# Patient Record
Sex: Female | Born: 1995 | Race: Black or African American | Hispanic: No | Marital: Single | Smoking: Never smoker
Health system: Southern US, Community
[De-identification: ages and names within clinical notes are randomized; demographics above are authoritative.]

## PROBLEM LIST (undated history)

## (undated) ENCOUNTER — Inpatient Hospital Stay (HOSPITAL_COMMUNITY): Payer: Self-pay

## (undated) DIAGNOSIS — D649 Anemia, unspecified: Secondary | ICD-10-CM

## (undated) HISTORY — PX: LIPOSUCTION: SHX10

## (undated) HISTORY — DX: Anemia, unspecified: D64.9

## (undated) HISTORY — PX: INDUCED ABORTION: SHX677

## (undated) HISTORY — PX: EYE SURGERY: SHX253

## (undated) HISTORY — PX: OTHER SURGICAL HISTORY: SHX169

## (undated) HISTORY — PX: BREAST SURGERY: SHX581

---
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
## 2014-05-08 LAB — OB RESULTS CONSOLE HEPATITIS B SURFACE ANTIGEN: HEP B S AG: NEGATIVE

## 2014-05-08 LAB — OB RESULTS CONSOLE RPR: RPR: NONREACTIVE

## 2014-05-08 LAB — OB RESULTS CONSOLE ANTIBODY SCREEN: Antibody Screen: NEGATIVE

## 2014-05-08 LAB — OB RESULTS CONSOLE ABO/RH: RH Type: POSITIVE

## 2014-05-08 LAB — OB RESULTS CONSOLE RUBELLA ANTIBODY, IGM: Rubella: IMMUNE

## 2014-05-08 LAB — OB RESULTS CONSOLE HIV ANTIBODY (ROUTINE TESTING): HIV: NONREACTIVE

## 2014-05-29 LAB — OB RESULTS CONSOLE GC/CHLAMYDIA
CHLAMYDIA, DNA PROBE: NEGATIVE
Gonorrhea: NEGATIVE

## 2014-10-29 ENCOUNTER — Inpatient Hospital Stay (HOSPITAL_COMMUNITY)
Admission: AD | Admit: 2014-10-29 | Discharge: 2014-10-30 | Disposition: A | Discharge status: Home / Self Care | Payer: BC Managed Care – PPO | Source: Ambulatory Visit | Attending: Obstetrics | Admitting: Obstetrics

## 2014-10-29 ENCOUNTER — Encounter (HOSPITAL_COMMUNITY): Payer: Self-pay | Admitting: *Deleted

## 2014-10-29 DIAGNOSIS — R109 Unspecified abdominal pain: Secondary | ICD-10-CM | POA: Diagnosis present

## 2014-10-29 DIAGNOSIS — Z3A31 31 weeks gestation of pregnancy: Secondary | ICD-10-CM | POA: Insufficient documentation

## 2014-10-29 DIAGNOSIS — O9989 Other specified diseases and conditions complicating pregnancy, childbirth and the puerperium: Secondary | ICD-10-CM | POA: Insufficient documentation

## 2014-10-29 DIAGNOSIS — R1030 Lower abdominal pain, unspecified: Secondary | ICD-10-CM | POA: Diagnosis not present

## 2014-10-29 DIAGNOSIS — M5430 Sciatica, unspecified side: Secondary | ICD-10-CM

## 2014-10-29 LAB — URINALYSIS, ROUTINE W REFLEX MICROSCOPIC
Bilirubin Urine: NEGATIVE
GLUCOSE, UA: NEGATIVE mg/dL
HGB URINE DIPSTICK: NEGATIVE
Ketones, ur: NEGATIVE mg/dL
Nitrite: NEGATIVE
PH: 6 (ref 5.0–8.0)
PROTEIN: NEGATIVE mg/dL
Specific Gravity, Urine: 1.025 (ref 1.005–1.030)
Urobilinogen, UA: 0.2 mg/dL (ref 0.0–1.0)

## 2014-10-29 LAB — URINE MICROSCOPIC-ADD ON

## 2014-10-29 NOTE — MAU Note (Signed)
Patient presents at 7531 weeks gestation with complaint of lower abdominal pain. Denies bleeding or LOF. Fetus active.

## 2014-10-29 NOTE — MAU Provider Note (Signed)
  History     CSN: [id number]  Arrival date and time: 10/29/14 2103 Nurse call to provider - orders gven @ 2238 Provider here to see patient @ 2330    Chief Complaint  Patient presents with  . Abdominal Pain   HPI  Sharp abdominal pain that was shooting into buttocks - so hard unable to walk Happened x 1 this evening when getting off work Some lower abdominal stretching pains / intermittent vaginal and rectal pressure No bleeding or discharge No LOF Very active FM  Past Medical History  Diagnosis Date  . Anemia     Past Surgical History  Procedure Laterality Date  . Eye surgery      History reviewed. No pertinent family history.  History  Substance Use Topics  . Smoking status: Never Smoker   . Smokeless tobacco: Not on file  . Alcohol Use: No    Allergies:  Allergies  Allergen Reactions  . Latex Swelling    Prescriptions prior to admission  Medication Sig Dispense Refill Last Dose  . Prenatal Vit-Fe Fumarate-FA (PRENATAL MULTIVITAMIN) TABS tablet Take 1 tablet by mouth daily at 12 noon.   Past Week at Unknown time    ROS  + FM Pain x 1 this evening after getting off work Intermittent pressure  No urinary frequency or urgency No bleeding or vaginal discharge  Physical Exam   Blood pressure 127/77, pulse 71, temperature 98.9 F (37.2 C), temperature source Oral, resp. rate 16, height 5\' 5"  (1.651 m), weight 86.354 kg (190 lb 6 oz), last menstrual period 03/21/2014.  Physical Exam  Alert and oriented x 3 NAD or pain Abdomen soft and non-tender Leopalds cephalic - non-tender with exam / palpation VE - cervix closed / soft / long ~ 3cm length in vagina / vtx -3 ballotable  FHR 140 - moderate variability / + accels / no decels toco occasional ctx x 30-40 seconds  Urinalysis - 1025 / + WBC / TNTC bacteria Urine culture pending  MAU Course  Procedures  NST - reactive  Assessment and Plan  31.5 weeks with normal pregnancy discomfort No  evidence of PTL or UTI  1) reviewed normal physiology of pregnancy / MS discomforts / sciatica 2) comfort measures reviewed - position changes / tub or shower / increase water hydration 3) DC home 4) ROB as scheduled tomorrow 5) call prior to hospital visit  Marlinda MikeBAILEY, Leah Titsworth 10/29/2014, 11:39 PM

## 2014-10-30 DIAGNOSIS — O9989 Other specified diseases and conditions complicating pregnancy, childbirth and the puerperium: Secondary | ICD-10-CM | POA: Diagnosis not present

## 2014-10-31 LAB — CULTURE, OB URINE
Colony Count: >=100000
Special Requests: NORMAL

## 2014-11-16 NOTE — L&D Delivery Note (Signed)
Delivery Note At 11:16 AM a healthy female was delivered via Vaginal, Spontaneous Delivery (Presentation: Left Occiput Anterior).  APGAR: 9, 9; weight 6 lb 7.3 oz (2929 g).  3 tight Nuchal cords.  Placenta status: Intact, Spontaneous.  Cord: 3 vessels with the following complications: None.  Cord pH: N/A  Anesthesia: Epidural  Episiotomy: None Lacerations: 1st degree;Perineal and left vulvar tears Suture Repair: 2.0 3.0 vicryl rapide Est. Blood Loss (mL): 300  Mom to postpartum.  Baby to Couplet care / Skin to Skin.  Gwenith Tschida,MARIE-LYNE 12/19/2014, 8:46 PM

## 2014-11-28 LAB — OB RESULTS CONSOLE GBS: GBS: NEGATIVE

## 2014-12-18 ENCOUNTER — Inpatient Hospital Stay (HOSPITAL_COMMUNITY)
Admission: AD | Admit: 2014-12-18 | Discharge: 2014-12-18 | Disposition: A | Discharge status: Home / Self Care | Payer: BLUE CROSS/BLUE SHIELD | Source: Ambulatory Visit | Attending: Obstetrics | Admitting: Obstetrics

## 2014-12-18 ENCOUNTER — Encounter (HOSPITAL_COMMUNITY): Payer: Self-pay | Admitting: *Deleted

## 2014-12-18 ENCOUNTER — Inpatient Hospital Stay (HOSPITAL_COMMUNITY)
Admission: AD | Admit: 2014-12-18 | Discharge: 2014-12-21 | DRG: 775 | Disposition: A | Discharge status: Home / Self Care | Payer: BLUE CROSS/BLUE SHIELD | Source: Ambulatory Visit | Attending: Obstetrics & Gynecology | Admitting: Obstetrics & Gynecology

## 2014-12-18 DIAGNOSIS — Z91040 Latex allergy status: Secondary | ICD-10-CM

## 2014-12-18 DIAGNOSIS — O471 False labor at or after 37 completed weeks of gestation: Secondary | ICD-10-CM | POA: Insufficient documentation

## 2014-12-18 DIAGNOSIS — Z3A39 39 weeks gestation of pregnancy: Secondary | ICD-10-CM | POA: Insufficient documentation

## 2014-12-18 DIAGNOSIS — O9902 Anemia complicating childbirth: Secondary | ICD-10-CM | POA: Diagnosis present

## 2014-12-18 DIAGNOSIS — D62 Acute posthemorrhagic anemia: Secondary | ICD-10-CM | POA: Diagnosis present

## 2014-12-18 DIAGNOSIS — Z3400 Encounter for supervision of normal first pregnancy, unspecified trimester: Secondary | ICD-10-CM

## 2014-12-18 NOTE — MAU Note (Signed)
PT  SAYS SHE STARTED  HURTING  BAD AT 0230.    VE IN OFFICE ON Monday  -  1-2  CM.     DENIES HSV AND MRSA.    GBS-  NEG

## 2014-12-18 NOTE — Discharge Instructions (Signed)
Braxton Hicks Contractions °Contractions of the uterus can occur throughout pregnancy. Contractions are not always a sign that you are in labor.  °WHAT ARE BRAXTON HICKS CONTRACTIONS?  °Contractions that occur before labor are called Braxton Hicks contractions, or false labor. Toward the end of pregnancy (32-34 weeks), these contractions can develop more often and may become more forceful. This is not true labor because these contractions do not result in opening (dilatation) and thinning of the cervix. They are sometimes difficult to tell apart from true labor because these contractions can be forceful and people have different pain tolerances. You should not feel embarrassed if you go to the hospital with false labor. Sometimes, the only way to tell if you are in true labor is for your health care provider to look for changes in the cervix. °If there are no prenatal problems or other health problems associated with the pregnancy, it is completely safe to be sent home with false labor and await the onset of true labor. °HOW CAN YOU TELL THE DIFFERENCE BETWEEN TRUE AND FALSE LABOR? °False Labor °· The contractions of false labor are usually shorter and not as hard as those of true labor.   °· The contractions are usually irregular.   °· The contractions are often felt in the front of the lower abdomen and in the groin.   °· The contractions may go away when you walk around or change positions while lying down.   °· The contractions get weaker and are shorter lasting as time goes on.   °· The contractions do not usually become progressively stronger, regular, and closer together as with true labor.   °True Labor °· Contractions in true labor last 30-70 seconds, become very regular, usually become more intense, and increase in frequency.   °· The contractions do not go away with walking.   °· The discomfort is usually felt in the top of the uterus and spreads to the lower abdomen and low back.   °· True labor can be  determined by your health care provider with an exam. This will show that the cervix is dilating and getting thinner.   °WHAT TO REMEMBER °· Keep up with your usual exercises and follow other instructions given by your health care provider.   °· Take medicines as directed by your health care provider.   °· Keep your regular prenatal appointments.   °· Eat and drink lightly if you think you are going into labor.   °· If Braxton Hicks contractions are making you uncomfortable:   °¨ Change your position from lying down or resting to walking, or from walking to resting.   °¨ Sit and rest in a tub of warm water.   °¨ Drink 2-3 glasses of water. Dehydration may cause these contractions.   °¨ Do slow and deep breathing several times an hour.   °WHEN SHOULD I SEEK IMMEDIATE MEDICAL CARE? °Seek immediate medical care if: °· Your contractions become stronger, more regular, and closer together.   °· You have fluid leaking or gushing from your vagina.   °· You have a fever.    °· You have vaginal bleeding.   °· You have continuous abdominal pain.   °· You have low back pain that you never had before.   °· You feel your baby's head pushing down and causing pelvic pressure.   °· Your baby is not moving as much as it used to.   °Document Released: 11/02/2005 Document Revised: 11/07/2013 Document Reviewed: 08/14/2013 °ExitCare® Patient Information ©2015 [employer]. This information is not intended to replace advice given to you by your health care provider. Make sure you discuss any   questions you have with your health care provider. ° °

## 2014-12-18 NOTE — MAU Note (Signed)
Pt reports contractions, denies bleeding or ROM.  

## 2014-12-19 ENCOUNTER — Inpatient Hospital Stay (HOSPITAL_COMMUNITY): Payer: BLUE CROSS/BLUE SHIELD | Admitting: Anesthesiology

## 2014-12-19 ENCOUNTER — Encounter (HOSPITAL_COMMUNITY): Payer: Self-pay | Admitting: *Deleted

## 2014-12-19 DIAGNOSIS — Z91040 Latex allergy status: Secondary | ICD-10-CM | POA: Diagnosis not present

## 2014-12-19 DIAGNOSIS — Z3400 Encounter for supervision of normal first pregnancy, unspecified trimester: Secondary | ICD-10-CM

## 2014-12-19 DIAGNOSIS — D62 Acute posthemorrhagic anemia: Secondary | ICD-10-CM | POA: Diagnosis present

## 2014-12-19 DIAGNOSIS — Z3A39 39 weeks gestation of pregnancy: Secondary | ICD-10-CM | POA: Diagnosis present

## 2014-12-19 DIAGNOSIS — O9902 Anemia complicating childbirth: Secondary | ICD-10-CM | POA: Diagnosis present

## 2014-12-19 LAB — CBC
HCT: 34 % — ABNORMAL LOW (ref 36.0–46.0)
Hemoglobin: 11.7 g/dL — ABNORMAL LOW (ref 12.0–15.0)
MCH: 27.6 pg (ref 26.0–34.0)
MCHC: 34.4 g/dL (ref 30.0–36.0)
MCV: 80.2 fL (ref 78.0–100.0)
Platelets: 201 10*3/uL (ref 150–400)
RBC: 4.24 MIL/uL (ref 3.87–5.11)
RDW: 14.6 % (ref 11.5–15.5)
WBC: 11.5 10*3/uL — ABNORMAL HIGH (ref 4.0–10.5)

## 2014-12-19 LAB — TYPE AND SCREEN
ABO/RH(D): O POS
ANTIBODY SCREEN: NEGATIVE

## 2014-12-19 LAB — ABO/RH: ABO/RH(D): O POS

## 2014-12-19 MED ORDER — ONDANSETRON HCL 4 MG PO TABS: 4.0000 mg | ORAL_TABLET | ORAL | Status: DC | PRN

## 2014-12-19 MED ORDER — PHENYLEPHRINE 40 MCG/ML (10ML) SYRINGE FOR IV PUSH (FOR BLOOD PRESSURE SUPPORT)
80.0000 ug | PREFILLED_SYRINGE | INTRAVENOUS | Status: DC | PRN
  Filled 2014-12-19: qty 2
  Filled 2014-12-19: qty 20

## 2014-12-19 MED ORDER — OXYTOCIN 40 UNITS IN LACTATED RINGERS INFUSION - SIMPLE MED
1.0000 m[IU]/min | INTRAVENOUS | Status: DC
  Administered 2014-12-19: 2 m[IU]/min via INTRAVENOUS

## 2014-12-19 MED ORDER — LIDOCAINE HCL (PF) 1 % IJ SOLN
30.0000 mL | INTRAMUSCULAR | Status: DC | PRN
  Administered 2014-12-19: 30 mL via SUBCUTANEOUS
  Filled 2014-12-19: qty 30

## 2014-12-19 MED ORDER — TETANUS-DIPHTH-ACELL PERTUSSIS 5-2.5-18.5 LF-MCG/0.5 IM SUSP: 0.5000 mL | Freq: Once | INTRAMUSCULAR | Status: DC

## 2014-12-19 MED ORDER — LACTATED RINGERS IV SOLN: 500.0000 mL | Freq: Once | INTRAVENOUS | Status: DC

## 2014-12-19 MED ORDER — DIPHENHYDRAMINE HCL 25 MG PO CAPS: 25.0000 mg | ORAL_CAPSULE | Freq: Four times a day (QID) | ORAL | Status: DC | PRN

## 2014-12-19 MED ORDER — SIMETHICONE 80 MG PO CHEW: 80.0000 mg | CHEWABLE_TABLET | ORAL | Status: DC | PRN

## 2014-12-19 MED ORDER — BENZOCAINE-MENTHOL 20-0.5 % EX AERO: 1.0000 "application " | INHALATION_SPRAY | CUTANEOUS | Status: DC | PRN

## 2014-12-19 MED ORDER — ONDANSETRON HCL 4 MG/2ML IJ SOLN: 4.0000 mg | Freq: Four times a day (QID) | INTRAMUSCULAR | Status: DC | PRN

## 2014-12-19 MED ORDER — LACTATED RINGERS IV SOLN: 500.0000 mL | INTRAVENOUS | Status: DC | PRN

## 2014-12-19 MED ORDER — LANOLIN HYDROUS EX OINT: TOPICAL_OINTMENT | CUTANEOUS | Status: DC | PRN

## 2014-12-19 MED ORDER — FENTANYL 2.5 MCG/ML BUPIVACAINE 1/10 % EPIDURAL INFUSION (WH - ANES)
14.0000 mL/h | INTRAMUSCULAR | Status: DC | PRN
  Administered 2014-12-19: 14 mL/h via EPIDURAL

## 2014-12-19 MED ORDER — OXYCODONE-ACETAMINOPHEN 5-325 MG PO TABS
1.0000 | ORAL_TABLET | ORAL | Status: DC | PRN
  Administered 2014-12-19: 1 via ORAL

## 2014-12-19 MED ORDER — LACTATED RINGERS IV SOLN
INTRAVENOUS | Status: DC
  Administered 2014-12-19: 04:00:00 via INTRAVENOUS

## 2014-12-19 MED ORDER — ACETAMINOPHEN 325 MG PO TABS: 650.0000 mg | ORAL_TABLET | ORAL | Status: DC | PRN

## 2014-12-19 MED ORDER — ZOLPIDEM TARTRATE 5 MG PO TABS: 5.0000 mg | ORAL_TABLET | Freq: Every evening | ORAL | Status: DC | PRN

## 2014-12-19 MED ORDER — OXYCODONE-ACETAMINOPHEN 5-325 MG PO TABS: 2.0000 | ORAL_TABLET | ORAL | Status: DC | PRN

## 2014-12-19 MED ORDER — IBUPROFEN 600 MG PO TABS
600.0000 mg | ORAL_TABLET | Freq: Four times a day (QID) | ORAL | Status: DC
  Administered 2014-12-19 (×2): 600 mg via ORAL
  Filled 2014-12-19 (×2): qty 1

## 2014-12-19 MED ORDER — LIDOCAINE HCL (PF) 1 % IJ SOLN
INTRAMUSCULAR | Status: DC | PRN
  Administered 2014-12-19: 4 mL
  Administered 2014-12-19: 6 mL

## 2014-12-19 MED ORDER — EPHEDRINE 5 MG/ML INJ
10.0000 mg | INTRAVENOUS | Status: DC | PRN
  Filled 2014-12-19: qty 2

## 2014-12-19 MED ORDER — PRENATAL MULTIVITAMIN CH: 1.0000 | ORAL_TABLET | Freq: Every day | ORAL | Status: DC

## 2014-12-19 MED ORDER — PHENYLEPHRINE 40 MCG/ML (10ML) SYRINGE FOR IV PUSH (FOR BLOOD PRESSURE SUPPORT)
80.0000 ug | PREFILLED_SYRINGE | INTRAVENOUS | Status: DC | PRN
  Filled 2014-12-19: qty 2

## 2014-12-19 MED ORDER — DIBUCAINE 1 % RE OINT: 1.0000 "application " | TOPICAL_OINTMENT | RECTAL | Status: DC | PRN

## 2014-12-19 MED ORDER — OXYCODONE-ACETAMINOPHEN 5-325 MG PO TABS
1.0000 | ORAL_TABLET | ORAL | Status: DC | PRN
  Filled 2014-12-19: qty 1

## 2014-12-19 MED ORDER — OXYTOCIN 40 UNITS IN LACTATED RINGERS INFUSION - SIMPLE MED
62.5000 mL/h | INTRAVENOUS | Status: DC
  Administered 2014-12-19: 62.5 mL/h via INTRAVENOUS
  Filled 2014-12-19: qty 1000

## 2014-12-19 MED ORDER — FLEET ENEMA 7-19 GM/118ML RE ENEM: 1.0000 | ENEMA | RECTAL | Status: DC | PRN

## 2014-12-19 MED ORDER — SENNOSIDES-DOCUSATE SODIUM 8.6-50 MG PO TABS: 2.0000 | ORAL_TABLET | ORAL | Status: DC

## 2014-12-19 MED ORDER — WITCH HAZEL-GLYCERIN EX PADS
1.0000 "application " | MEDICATED_PAD | CUTANEOUS | Status: DC | PRN
  Administered 2014-12-19: 1 via TOPICAL

## 2014-12-19 MED ORDER — OXYTOCIN BOLUS FROM INFUSION: 500.0000 mL | INTRAVENOUS | Status: DC

## 2014-12-19 MED ORDER — CITRIC ACID-SODIUM CITRATE 334-500 MG/5ML PO SOLN: 30.0000 mL | ORAL | Status: DC | PRN

## 2014-12-19 MED ORDER — ONDANSETRON HCL 4 MG/2ML IJ SOLN: 4.0000 mg | INTRAMUSCULAR | Status: DC | PRN

## 2014-12-19 MED ORDER — TERBUTALINE SULFATE 1 MG/ML IJ SOLN
0.2500 mg | Freq: Once | INTRAMUSCULAR | Status: DC | PRN
  Filled 2014-12-19: qty 1

## 2014-12-19 MED ORDER — DIPHENHYDRAMINE HCL 50 MG/ML IJ SOLN: 12.5000 mg | INTRAMUSCULAR | Status: DC | PRN

## 2014-12-19 MED ORDER — FENTANYL 2.5 MCG/ML BUPIVACAINE 1/10 % EPIDURAL INFUSION (WH - ANES)
14.0000 mL/h | INTRAMUSCULAR | Status: DC | PRN
  Administered 2014-12-19: 14 mL/h via EPIDURAL
  Filled 2014-12-19: qty 125

## 2014-12-19 NOTE — Anesthesia Postprocedure Evaluation (Signed)
Anesthesia Post Note  Patient: Leah Webb  Procedure(s) Performed: * No procedures listed *  Anesthesia type: Epidural  Patient location: Mother/Baby  Post pain: Pain level controlled  Post assessment: Post-op Vital signs reviewed  Last Vitals:  Filed Vitals:   12/19/14 1820  BP: 126/69  Pulse: 86  Temp: 37.1 C  Resp: 18    Post vital signs: Reviewed  Level of consciousness:alert  Complications: No apparent anesthesia complications

## 2014-12-19 NOTE — Anesthesia Procedure Notes (Signed)
Epidural Patient location during procedure: OB  Preanesthetic Checklist Completed: patient identified, site marked, surgical consent, pre-op evaluation, timeout performed, IV checked, risks and benefits discussed and monitors and equipment checked  Epidural Patient position: sitting Prep: site prepped and draped and DuraPrep Patient monitoring: continuous pulse ox and blood pressure Approach: midline Location: L3-L4 Injection technique: LOR air  Needle:  Needle type: Tuohy  Needle gauge: 17 G Needle length: 9 cm and 9 Needle insertion depth: 7 cm Catheter type: closed end flexible Catheter size: 19 Gauge Catheter at skin depth: 14 cm Test dose: negative  Assessment Events: blood not aspirated, injection not painful, no injection resistance, negative IV test and no paresthesia  Additional Notes Dosing of Epidural:  1st dose, through catheter .............................................  Xylocaine 40 mg  2nd dose, through catheter, after waiting 3 minutes.........Xylocaine 60 mg    ( 1% Xylo charted as a single dose in Epic Meds for ease of charting; actual dosing was fractionated as above, for saftey's sake)  As each dose occurred, patient was free of IV sx; and patient exhibited no evidence of SA injection.  Patient is more comfortable after epidural dosed. Please see RN's note for documentation of vital signs,and FHR which are stable.  Patient reminded not to try to ambulate with numb legs, and that an RN must be present when she attempts to get up.       

## 2014-12-19 NOTE — Anesthesia Preprocedure Evaluation (Signed)

## 2014-12-19 NOTE — H&P (Signed)
Leah Webb is a 19 y.o. female G1P0 582w0d presenting for Spontaneous Labor.  HPP:  Completely normal pregnancy.  AGA per [facility].  OB History    Gravida Para Term Preterm AB TAB SAB Ectopic Multiple Living   1         0     Past Medical History  Diagnosis Date  . Anemia    Past Surgical History  Procedure Laterality Date  . Eye surgery     Family History: family history is not on file. Social History:  reports that she has never smoked. She has never used smokeless tobacco. She reports that she does not drink alcohol or use illicit drugs.  Allergies  Allergen Reactions  . Latex Swelling    Dilation: 5 Effacement (%): 90 Station: 0 Exam by:: Nikesh Teschner Blood pressure 108/58, pulse 83, temperature 98.8 F (37.1 C), temperature source Oral, resp. rate 18, height 5\' 4"  (1.626 m), weight 200 lb (90.719 kg), last menstrual period 03/21/2014, SpO2 99 %. Exam Physical Exam   VE 5/90%Vtx/0 AROM AF clear UCs q3-5 min ++  FHR monitoring 140/min with good variability, accelerations present, no deceleration  HPP:  Patient Active Problem List   Diagnosis Date Noted  . Supervision of normal first pregnancy 12/19/2014  . Eczema, dyshidrotic 02/22/2012  . DUB (dysfunctional uterine bleeding) 02/22/2012  . Anemia 02/22/2012    Prenatal labs: ABO, Rh: --/--/O POS, O POS (02/03 0300) Antibody: NEG (02/03 0300) Rubella:  Immune RPR: Nonreactive (06/23 0000)  HBsAg: Negative (06/23 0000)  HIV: Non-reactive (06/23 0000)  Genetic testing: Declined.  AFP1 neg US anato: wnl 1 hr GTT: wnl GBS: Negative (01/13 0000)   Assessment/Plan: 38+ wks Spontaneous active labor.  Comfortable with epidural.  FHR monitoring Cat. 1.  Will add Pitocin for augmentation PRN.   Maykayla Highley,MARIE-LYNE 12/19/2014, 8:33 AM

## 2014-12-19 NOTE — Lactation Note (Signed)
This note was copied from the chart of Leah Webb. Lactation Consultation Note Initial visit at 7 hours of age.  Mom reports several good feedings and MBU RN at bedside reports mom is doing well.  Baby has had 3 feedings of at least 20 minutes wit 1 stool, but no void yet.  Last latch score was a "9".  Mom denies pain or problems at this time.  Last feeding about 1 hour ago.  Baby quiet in visitors arms liking mouth.  Discussed this as an early feeding cue, but due to visitors mom will delay feeding.  [facility] resources given and discussed.  Encouraged to feed with early cues on demand or to wake baby every 3 hours. Early newborn behavior discussed.  Hand expression demonstrated with colostrum visible.  Mom to call for assist as needed.    Patient Name: Leah Leah GirtBrandy Brossart RUEAV'WToday's Date: 12/19/2014 Reason for consult: Initial assessment   Maternal Data Has patient been taught Hand Expression?: Yes Does the patient have breastfeeding experience prior to this delivery?: No  Feeding Feeding Type: Breast Fed Length of feed: 20 min  LATCH Score/Interventions Latch: Grasps breast easily, tongue down, lips flanged, rhythmical sucking.  Audible Swallowing: A few with stimulation  Type of Nipple: Everted at rest and after stimulation  Comfort (Breast/Nipple): Soft / non-tender     Hold (Positioning): No assistance needed to correctly position infant at breast. Intervention(s): Breastfeeding basics reviewed  LATCH Score: 9  Lactation Tools Discussed/Used     Consult Status Consult Status: Follow-up Date: 12/20/14 Follow-up type: In-patient    Leah Webb, Leah Webb 12/19/2014, 6:39 PM

## 2014-12-19 NOTE — Progress Notes (Signed)
Delivery of live viable female by dr Seymour Barslavoie, APGARS 9,9

## 2014-12-20 LAB — RPR: RPR Ser Ql: NONREACTIVE

## 2014-12-20 MED ORDER — IBUPROFEN 600 MG PO TABS: 600.0000 mg | ORAL_TABLET | Freq: Four times a day (QID) | ORAL | Status: DC | PRN

## 2014-12-20 MED ORDER — IBUPROFEN 600 MG PO TABS
600.0000 mg | ORAL_TABLET | Freq: Four times a day (QID) | ORAL | Status: DC
  Administered 2014-12-20 – 2014-12-21 (×5): 600 mg via ORAL
  Filled 2014-12-20 (×5): qty 1

## 2014-12-20 MED ORDER — BENZOCAINE-MENTHOL 20-0.5 % EX AERO
1.0000 "application " | INHALATION_SPRAY | CUTANEOUS | Status: DC | PRN
  Administered 2014-12-20: 1 via TOPICAL
  Filled 2014-12-20: qty 56

## 2014-12-20 MED ORDER — OXYCODONE-ACETAMINOPHEN 5-325 MG PO TABS
1.0000 | ORAL_TABLET | ORAL | Status: DC | PRN
  Administered 2014-12-20 – 2014-12-21 (×2): 1 via ORAL
  Filled 2014-12-20 (×2): qty 1

## 2014-12-20 MED ORDER — ZOLPIDEM TARTRATE 5 MG PO TABS: 5.0000 mg | ORAL_TABLET | Freq: Every evening | ORAL | Status: DC | PRN

## 2014-12-20 MED ORDER — WITCH HAZEL-GLYCERIN EX PADS: 1.0000 "application " | MEDICATED_PAD | CUTANEOUS | Status: DC | PRN

## 2014-12-20 MED ORDER — ONDANSETRON HCL 4 MG/2ML IJ SOLN: 4.0000 mg | INTRAMUSCULAR | Status: DC | PRN

## 2014-12-20 MED ORDER — PRENATAL MULTIVITAMIN CH
1.0000 | ORAL_TABLET | Freq: Every day | ORAL | Status: DC
  Administered 2014-12-20: 1 via ORAL
  Filled 2014-12-20: qty 1

## 2014-12-20 MED ORDER — OXYCODONE-ACETAMINOPHEN 5-325 MG PO TABS
1.0000 | ORAL_TABLET | ORAL | Status: DC | PRN
  Administered 2014-12-20: 1 via ORAL
  Filled 2014-12-20: qty 1

## 2014-12-20 MED ORDER — DIPHENHYDRAMINE HCL 25 MG PO CAPS: 25.0000 mg | ORAL_CAPSULE | Freq: Four times a day (QID) | ORAL | Status: DC | PRN

## 2014-12-20 MED ORDER — ONDANSETRON HCL 4 MG PO TABS: 4.0000 mg | ORAL_TABLET | ORAL | Status: DC | PRN

## 2014-12-20 MED ORDER — LACTATED RINGERS IV SOLN: INTRAVENOUS | Status: DC

## 2014-12-20 MED ORDER — SENNOSIDES-DOCUSATE SODIUM 8.6-50 MG PO TABS
2.0000 | ORAL_TABLET | ORAL | Status: DC
  Administered 2014-12-20: 2 via ORAL
  Filled 2014-12-20: qty 2

## 2014-12-20 MED ORDER — OXYCODONE-ACETAMINOPHEN 5-325 MG PO TABS: 2.0000 | ORAL_TABLET | ORAL | Status: DC | PRN

## 2014-12-20 MED ORDER — TETANUS-DIPHTH-ACELL PERTUSSIS 5-2.5-18.5 LF-MCG/0.5 IM SUSP: 0.5000 mL | Freq: Once | INTRAMUSCULAR | Status: DC

## 2014-12-20 MED ORDER — SIMETHICONE 80 MG PO CHEW: 80.0000 mg | CHEWABLE_TABLET | ORAL | Status: DC | PRN

## 2014-12-20 MED ORDER — LANOLIN HYDROUS EX OINT: TOPICAL_OINTMENT | CUTANEOUS | Status: DC | PRN

## 2014-12-20 MED ORDER — DIBUCAINE 1 % RE OINT: 1.0000 "application " | TOPICAL_OINTMENT | RECTAL | Status: DC | PRN

## 2014-12-20 NOTE — Progress Notes (Addendum)
Pt called out for pain medicine realized that she did not have any orders at all. Called CNM for post partum orders so that I could give her pain medication, was given verbal orders for pain medication I repeated that the patient did not have postpartum orders and the pain medication orders were repeated with no additional orders added.

## 2014-12-20 NOTE — Progress Notes (Signed)
Patient ID: Golda AcreBrandy A Weinberg, female   DOB: 11/03/1996, 19 y.o.   MRN: [id number] PPD # 1 SVD  S:  Reports feeling well             Tolerating po/ No nausea or vomiting             Bleeding is light             Pain controlled with ibuprofen (OTC)             Up ad lib / ambulatory / voiding without difficulties    Newborn  Information for the patient's newborn:  Sindy MessingBowden, Boy Halie [[id number]][[id number]]  female  breast feeding  / Circumcision done   O:  A & O x 3, in no apparent distress              VS:  Filed Vitals:   12/19/14 1330 12/19/14 1445 12/19/14 1820 12/20/14 0515  BP: 125/70 122/79 126/69 113/62  Pulse: 97 86 86 79  Temp: 98.7 F (37.1 C) 98.3 F (36.8 C) 98.7 F (37.1 C) 98.2 F (36.8 C)  TempSrc: Tympanic Oral Oral Oral  Resp: 18 18 18 18   Height:      Weight:      SpO2:   100%     LABS:  Recent Labs  12/19/14 0300  WBC 11.5*  HGB 11.7*  HCT 34.0*  PLT 201    Blood type: O POS (02/03 0300)  Rubella: Immune (06/23 0000)   I&O: I/O last 3 completed shifts: In: -  Out: 900 [Urine:600; Blood:300]             Lungs: Clear and unlabored  Heart: regular rate and rhythm / no murmurs  Abdomen: soft, non-tender, non-distended              Fundus: firm, non-tender, U-1  Perineum: 1st degree repair healing well, moderate edema - ice pack in place  Lochia: minimal  Extremities: no edema, no calf pain or tenderness, no Homans    A/P: PPD # 1  18 y.o., G1P1001   Principal Problem:    Postpartum care following vaginal delivery (2/3)  Active Problems:    Supervision of normal first pregnancy    Postpartum state   Doing well - stable status  Routine post partum orders  Anticipate discharge tomorrow    Raelyn MoraAWSON, Paylin Hailu, M, MSN, CNM 12/20/2014, 10:49 AM

## 2014-12-21 LAB — CBC
HEMATOCRIT: 27.2 % — AB (ref 36.0–46.0)
Hemoglobin: 9.2 g/dL — ABNORMAL LOW (ref 12.0–15.0)
MCH: 27.6 pg (ref 26.0–34.0)
MCHC: 33.8 g/dL (ref 30.0–36.0)
MCV: 81.7 fL (ref 78.0–100.0)
Platelets: 169 10*3/uL (ref 150–400)
RBC: 3.33 MIL/uL — AB (ref 3.87–5.11)
RDW: 14.7 % (ref 11.5–15.5)
WBC: 7.9 10*3/uL (ref 4.0–10.5)

## 2014-12-21 MED ORDER — IBUPROFEN 600 MG PO TABS: 600.0000 mg | ORAL_TABLET | Freq: Four times a day (QID) | ORAL | Status: DC

## 2014-12-21 MED ORDER — POLYSACCHARIDE IRON COMPLEX 150 MG PO CAPS: 150.0000 mg | ORAL_CAPSULE | Freq: Two times a day (BID) | ORAL | Status: DC

## 2014-12-21 MED ORDER — MAGNESIUM OXIDE 400 (241.3 MG) MG PO TABS
200.0000 mg | ORAL_TABLET | Freq: Every day | ORAL | Status: DC
  Administered 2014-12-21: 200 mg via ORAL
  Filled 2014-12-21: qty 0.5

## 2014-12-21 MED ORDER — MAGNESIUM OXIDE 400 (241.3 MG) MG PO TABS: 200.0000 mg | ORAL_TABLET | Freq: Every day | ORAL | Status: DC

## 2014-12-21 MED ORDER — POLYSACCHARIDE IRON COMPLEX 150 MG PO CAPS
150.0000 mg | ORAL_CAPSULE | Freq: Two times a day (BID) | ORAL | Status: DC
  Administered 2014-12-21: 150 mg via ORAL
  Filled 2014-12-21: qty 1

## 2014-12-21 NOTE — Lactation Note (Signed)
This note was copied from the chart of Leah Webb. Lactation Consultation NoteFollow up visit with mom before DC. Mom reports baby fed a lot through the night and her nipples are tender. Slightly pink but intact. Comfort gels given with instructions. No questions at present. To call prn  Patient Name: Leah Webb ZOXWR'UToday's Date: 12/21/2014 Reason for consult: Follow-up assessment   Maternal Data Formula Feeding for Exclusion: No Has patient been taught Hand Expression?: Yes Does the patient have breastfeeding experience prior to this delivery?: No  Feeding    LATCH Score/Interventions          Comfort (Breast/Nipple): Filling, red/small blisters or bruises, mild/mod discomfort  Problem noted: Mild/Moderate discomfort Interventions (Mild/moderate discomfort): Comfort gels;Hand expression        Lactation Tools Discussed/Used     Consult Status Consult Status: Complete    Pamelia HoitWeeks, Jeni Duling D 12/21/2014, 10:16 AM

## 2014-12-21 NOTE — Discharge Summary (Signed)
Obstetric Discharge Summary Reason for Admission: onset of labor Prenatal Procedures: ultrasound Intrapartum Procedures: spontaneous vaginal delivery Postpartum Procedures: none Complications-Operative and Postpartum: 1st degree perineal laceration HEMOGLOBIN  Date Value Ref Range Status  12/21/2014 9.2* 12.0 - 15.0 g/dL Final   HCT  Date Value Ref Range Status  12/21/2014 27.2* 36.0 - 46.0 % Final    Physical Exam:  General: alert and cooperative Lochia: appropriate Uterine Fundus: firm Incision: healing well, no significant drainage, no dehiscence, no significant erythema DVT Evaluation: No evidence of DVT seen on physical exam. Negative Homan's sign. No cords or calf tenderness. No significant calf/ankle edema.  Discharge Diagnoses: Term Pregnancy-delivered, IDA with compounding ABL anemia  Discharge Information: Date: 12/21/2014 Activity: pelvic rest Diet: routine Medications: PNV, Ibuprofen and Iron Condition: stable Instructions: refer to practice specific booklet Discharge to: home Follow-up Information    Follow up with LAVOIE,MARIE-LYNE, MD. Schedule an appointment as soon as possible for a visit in 6 weeks.   Specialty:  Obstetrics and Gynecology   Contact information:   [address] Severe[address] [place] [id number] [phone]       Newborn Data: Live born female  Birth Weight: 6 lb 7.3 oz (2929 g) APGAR: 9, 9  Home with mother.  Bayla Mcgovern, N 12/21/2014, 12:19 PM

## 2014-12-21 NOTE — Progress Notes (Signed)
PPD #2- SVD  Subjective:   Reports feeling well, ready for discharge Tolerating po/ No nausea or vomiting Bleeding is light Pain controlled with Motrin Up ad lib / ambulatory / voiding without problems Newborn: breastfeeding  / Circumcision: done   Objective:   VS: VS:  Filed Vitals:   12/19/14 1820 12/20/14 0515 12/20/14 1800 12/21/14 0603  BP: 126/69 113/62 135/76 120/62  Pulse: 86 79 67 62  Temp: 98.7 F (37.1 C) 98.2 F (36.8 C) 98 F (36.7 C) 98.9 F (37.2 C)  TempSrc: Oral Oral Oral Oral  Resp: 18 18 18 20   Height:      Weight:      SpO2: 100%       LABS:  Recent Labs  12/19/14 0300 12/21/14 0540  WBC 11.5* 7.9  HGB 11.7* 9.2*  PLT 201 169   Blood type: --/--/O POS, O POS (02/03 0300) Rubella: Immune (06/23 0000)                I&O: Intake/Output      02/04 0701 - 02/05 0700 02/05 0701 - 02/06 0700   Urine (mL/kg/hr)     Blood     Total Output       Net              Physical Exam: Alert and oriented X3 Abdomen: soft, non-tender, non-distended  Fundus: firm, non-tender, U-2 Perineum: Well approximated, no significant erythema, edema, or drainage; healing well. Lochia: small Extremities: No edema, no calf pain or tenderness    Assessment: PPD #2  G1P1001/ S/P:spontaneous vaginal, 1st degree laceration IDA with compounding ABL anemia Doing well - stable for discharge home   Plan: Discharge home RX's:  Ibuprofen 600mg  po Q 6 hrs prn pain #30 Refill x 0 Niferex 150mg  po BID #60 Refill x 1 Mag Oxide 200 mg po daily #30, refill x1 Routine pp visit in [employer] booklet given    Donette LarryBHAMBRI, Marvine Encalade, N MSN, CNM 12/21/2014, 12:16 PM

## 2015-07-31 ENCOUNTER — Ambulatory Visit (INDEPENDENT_AMBULATORY_CARE_PROVIDER_SITE_OTHER): Payer: Self-pay | Admitting: Family Medicine

## 2015-07-31 VITALS — BP 102/68 | HR 74 | Temp 98.5°F | Resp 18 | Ht 65.0 in | Wt 174.4 lb

## 2015-07-31 DIAGNOSIS — R42 Dizziness and giddiness: Secondary | ICD-10-CM | POA: Diagnosis not present

## 2015-07-31 DIAGNOSIS — R51 Headache: Secondary | ICD-10-CM

## 2015-07-31 DIAGNOSIS — R110 Nausea: Secondary | ICD-10-CM | POA: Diagnosis not present

## 2015-07-31 DIAGNOSIS — R519 Headache, unspecified: Secondary | ICD-10-CM

## 2015-07-31 LAB — POCT CBC
GRANULOCYTE PERCENT: 41.8 % (ref 37–80)
HEMATOCRIT: 36.3 % — AB (ref 37.7–47.9)
Hemoglobin: 10.7 g/dL — AB (ref 12.2–16.2)
Lymph, poc: 1.7 (ref 0.6–3.4)
MCH: 23.2 pg — AB (ref 27–31.2)
MCHC: 29.6 g/dL — AB (ref 31.8–35.4)
MCV: 78.3 fL — AB (ref 80–97)
MID (cbc): 0.5 (ref 0–0.9)
MPV: 8.1 fL (ref 0–99.8)
PLATELET COUNT, POC: 221 10*3/uL (ref 142–424)
POC GRANULOCYTE: 1.6 — AB (ref 2–6.9)
POC LYMPH %: 45.9 % (ref 10–50)
POC MID %: 12.3 %M — AB (ref 0–12)
RBC: 4.63 M/uL (ref 4.04–5.48)
RDW, POC: 15.4 %
WBC: 3.8 10*3/uL — AB (ref 4.6–10.2)

## 2015-07-31 LAB — COMPREHENSIVE METABOLIC PANEL
ALT: 15 U/L (ref 6–29)
AST: 20 U/L (ref 10–30)
Albumin: 4 g/dL (ref 3.6–5.1)
Alkaline Phosphatase: 61 U/L (ref 33–115)
BUN: 13 mg/dL (ref 7–25)
CHLORIDE: 103 mmol/L (ref 98–110)
CO2: 25 mmol/L (ref 20–31)
CREATININE: 0.88 mg/dL (ref 0.50–1.10)
Calcium: 9.2 mg/dL (ref 8.6–10.2)
GLUCOSE: 87 mg/dL (ref 65–99)
Potassium: 3.6 mmol/L (ref 3.5–5.3)
SODIUM: 137 mmol/L (ref 135–146)
Total Bilirubin: 0.3 mg/dL (ref 0.2–1.2)
Total Protein: 6.9 g/dL (ref 6.1–8.1)

## 2015-07-31 LAB — POCT URINALYSIS DIPSTICK
Bilirubin, UA: NEGATIVE
Blood, UA: NEGATIVE
Glucose, UA: NEGATIVE
LEUKOCYTES UA: NEGATIVE
Nitrite, UA: NEGATIVE
PROTEIN UA: NEGATIVE
UROBILINOGEN UA: 1
pH, UA: 6

## 2015-07-31 LAB — POCT UA - MICROSCOPIC ONLY
BACTERIA, U MICROSCOPIC: NEGATIVE
Casts, Ur, LPF, POC: NEGATIVE
Crystals, Ur, HPF, POC: NEGATIVE
Mucus, UA: NEGATIVE
RBC, URINE, MICROSCOPIC: NEGATIVE
WBC, UR, HPF, POC: NEGATIVE
YEAST UA: POSITIVE

## 2015-07-31 LAB — POCT URINE PREGNANCY: Preg Test, Ur: NEGATIVE

## 2015-07-31 NOTE — Progress Notes (Addendum)
Subjective:  This chart was scribed for Ethelda Chick, MD, MD by Andrew Au, ED Scribe. This patient was seen in room 4 and the patient's care was started at 4:30 PM.  Patient ID: Leah Webb, female    DOB: November 07, 1996, 19 y.o.   MRN: [id number]  HPI Chief Complaint  Patient presents with  . Headache    x 3 days  . Nausea    x 4 days  . Fatigue   HPI Comments: Leah Webb is a 19 y.o. female who presents to the [facility] for a 3 days history of HA, nausea.  Pt has had HA for 3 days but no HA today as well as nausea, fatigue, urinary frequency and sudden dizziness that occurs about 3 times a day and last only for a couple seconds. She reports similar symptoms when she was pregnant with her son, who is currently 5 months old, and believes she could possibly be pregnant. Pt is sexually active and takes oral birth control. Pt has also had sick contacts at home, including her son with nasal congestion.  Pt has anemia and takes iron pills, last blood work was when she was pregnant. She denies fever, rhinorrhea, nasal congestion, cough, visual disturbance, nausea, emesis, diarrhea, dysuria, abdominal pain, and vaginal discharge.  Pt works as a Social worker.   Patient Active Problem List   Diagnosis Date Noted  . Postpartum state 12/20/2014  . Supervision of normal first pregnancy 12/19/2014  . Postpartum care following vaginal delivery (2/3) 12/19/2014  . Eczema, dyshidrotic 02/22/2012  . DUB (dysfunctional uterine bleeding) 02/22/2012  . Anemia 02/22/2012   Past Medical History  Diagnosis Date  . Anemia    Prior to Admission medications   Medication Sig Start Date End Date Taking? Authorizing Provider  ibuprofen (ADVIL,MOTRIN) 600 MG tablet Take 1 tablet (600 mg total) by mouth every 6 (six) hours. Patient not taking: Reported on 07/31/2015 12/21/14   Lawernce Pitts, CNM  iron polysaccharides (NIFEREX) 150 MG capsule Take 1 capsule (150 mg total) by mouth  2 (two) times daily. Patient not taking: Reported on 07/31/2015 12/21/14   Lawernce Pitts, CNM  magnesium oxide (MAG-OX) 400 (241.3 MG) MG tablet Take 0.5 tablets (200 mg total) by mouth daily. Patient not taking: Reported on 07/31/2015 12/21/14   Lawernce Pitts, CNM  Prenatal Vit-Fe Fumarate-FA (PRENATAL MULTIVITAMIN) TABS tablet Take 1 tablet by mouth daily at 12 noon.    Historical Provider, MD   Social History   Social History  . Marital Status: Married    Spouse Name: N/A  . Number of Children: N/A  . Years of Education: N/A   Occupational History  . Not on file.   Social History Main Topics  . Smoking status: Never Smoker   . Smokeless tobacco: Never Used  . Alcohol Use: No  . Drug Use: No  . Sexual Activity: Yes    Birth Control/ Protection: None   Other Topics Concern  . Not on file   Social History Narrative   Family History  Problem Relation Age of Onset  . Heart disease Father   . Hyperlipidemia Maternal Grandmother     Review of Systems  Constitutional: Positive for fatigue. Negative for fever, chills and diaphoresis.  HENT: Negative for congestion, ear pain, rhinorrhea and sore throat.   Eyes: Negative for visual disturbance.  Respiratory: Negative for cough and shortness of breath.   Gastrointestinal: Positive for nausea. Negative for  vomiting, abdominal pain, diarrhea and abdominal distention.  Genitourinary: Positive for frequency. Negative for dysuria, urgency, hematuria, difficulty urinating, vaginal pain and pelvic pain.  Neurological: Positive for dizziness and headaches. Negative for tremors, seizures, syncope, facial asymmetry, speech difficulty, weakness, light-headedness and numbness.       Objective:   Physical Exam  Constitutional: She is oriented to person, place, and time. She appears well-developed and well-nourished. No distress.  HENT:  Head: Normocephalic and atraumatic.  Right Ear: External ear normal.  Left Ear: External ear  normal.  Nose: Nose normal.  Mouth/Throat: Oropharynx is clear and moist. No oropharyngeal exudate.  Eyes: Conjunctivae and EOM are normal. Pupils are equal, round, and reactive to light.  Neck: Normal range of motion. Neck supple. No thyromegaly present.  Cardiovascular: Normal rate, regular rhythm and normal heart sounds.  Exam reveals no gallop and no friction rub.   No murmur heard. Pulmonary/Chest: Effort normal. No respiratory distress. She has no wheezes. She has no rales.  Abdominal: Soft. Bowel sounds are normal. She exhibits no distension and no mass. There is no tenderness. There is no rebound and no guarding.  Musculoskeletal: Normal range of motion.  Lymphadenopathy:    She has no cervical adenopathy.  Neurological: She is alert and oriented to person, place, and time. No cranial nerve deficit. She exhibits normal muscle tone. She displays a negative Romberg sign. Coordination normal.  Finger to nose is normal  Skin: Skin is warm and dry.  Psychiatric: She has a normal mood and affect. Her behavior is normal.  Nursing note and vitals reviewed.   Filed Vitals:   07/31/15 1546  BP: 102/68  Pulse: 74  Temp: 98.5 F (36.9 C)  TempSrc: Oral  Resp: 18  Height:  (1.651 m)  Weight: 174 lb 6.4 oz (79.107 kg)  SpO2: 99%   Results for orders placed or performed in visit on 07/31/15  POCT urinalysis dipstick  Result Value Ref Range   Color, UA yellow    Clarity, UA clear    Glucose, UA neg    Bilirubin, UA neg    Ketones, UA trace    Spec Grav, UA >=1.030    Blood, UA neg    pH, UA 6.0    Protein, UA neg    Urobilinogen, UA 1.0    Nitrite, UA neg    Leukocytes, UA Negative Negative  POCT UA - Microscopic Only  Result Value Ref Range   WBC, Ur, HPF, POC neg    RBC, urine, microscopic neg    Bacteria, U Microscopic neg    Mucus, UA neg    Epithelial cells, urine per micros 0-3    Crystals, Ur, HPF, POC neg    Casts, Ur, LPF, POC neg    Yeast, UA positive     POCT urine pregnancy  Result Value Ref Range   Preg Test, Ur Negative Negative  POCT CBC  Result Value Ref Range   WBC 3.8 (A) 4.6 - 10.2 K/uL   Lymph, poc 1.7 0.6 - 3.4   POC LYMPH PERCENT 45.9 10 - 50 %L   MID (cbc) 0.5 0 - 0.9   POC MID % 12.3 (A) 0 - 12 %M   POC Granulocyte 1.6 (A) 2 - 6.9   Granulocyte percent 41.8 37 - 80 %G   RBC 4.63 4.04 - 5.48 M/uL   Hemoglobin 10.7 (A) 12.2 - 16.2 g/dL   HCT, POC 81.1 (A) 91.4 - 47.9 %   MCV 78.3 (  A) 80 - 97 fL   MCH, POC 23.2 (A) 27 - 31.2 pg   MCHC 29.6 (A) 31.8 - 35.4 g/dL   RDW, POC 16.1 %   Platelet Count, POC 221 142 - 424 K/uL   MPV 8.1 0 - 99.8 fL    Assessment & Plan:    1. Nausea without vomiting   2. Nonintractable headache, unspecified chronicity pattern, unspecified headache type   3. Dizziness     1. Nausea without vomiting: New.  BRAT diet, hydration; no evidence of pregnancy. 2.  Headache: New; now improved; recommend supportive care with rest, fluids, Ibuprofen. 3.  Dizziness; New; mild and intermittent; obtain labs; normal neurological exam; RTC for acute worsening; increase fluid intake.   Orders Placed This Encounter  Procedures  . Comprehensive metabolic panel  . POCT urinalysis dipstick  . POCT UA - Microscopic Only  . POCT urine pregnancy  . POCT CBC    No orders of the defined types were placed in this encounter.    I personally performed the services described in this documentation, which was scribed in my presence. The recorded information has been reviewed and considered.  Teyon Odette Paulita Fujita, M.D. [facility] [address] [place], [place]  [address] [phone] phone [phone] fax

## 2015-08-06 ENCOUNTER — Telehealth: Payer: Self-pay

## 2015-08-06 NOTE — Telephone Encounter (Signed)
Pt calling about labs. Please review. Thanks  

## 2015-08-17 NOTE — Telephone Encounter (Signed)
See labs 

## 2016-03-07 ENCOUNTER — Inpatient Hospital Stay (HOSPITAL_COMMUNITY)
Admission: AD | Admit: 2016-03-07 | Discharge: 2016-03-07 | Disposition: A | Discharge status: Home / Self Care | Payer: BLUE CROSS/BLUE SHIELD | Source: Ambulatory Visit | Attending: Obstetrics & Gynecology | Admitting: Obstetrics & Gynecology

## 2016-03-07 ENCOUNTER — Encounter (HOSPITAL_COMMUNITY): Payer: Self-pay | Admitting: *Deleted

## 2016-03-07 ENCOUNTER — Inpatient Hospital Stay (HOSPITAL_COMMUNITY): Payer: BLUE CROSS/BLUE SHIELD

## 2016-03-07 DIAGNOSIS — Z9889 Other specified postprocedural states: Secondary | ICD-10-CM

## 2016-03-07 DIAGNOSIS — O074 Failed attempted termination of pregnancy without complication: Secondary | ICD-10-CM

## 2016-03-07 DIAGNOSIS — O034 Incomplete spontaneous abortion without complication: Secondary | ICD-10-CM

## 2016-03-07 DIAGNOSIS — R58 Hemorrhage, not elsewhere classified: Secondary | ICD-10-CM

## 2016-03-07 DIAGNOSIS — O046 Delayed or excessive hemorrhage following (induced) termination of pregnancy: Secondary | ICD-10-CM | POA: Insufficient documentation

## 2016-03-07 LAB — URINE MICROSCOPIC-ADD ON: WBC, UA: NONE SEEN WBC/hpf (ref 0–5)

## 2016-03-07 LAB — POCT PREGNANCY, URINE: Preg Test, Ur: POSITIVE — AB

## 2016-03-07 LAB — CBC
HCT: 24.7 % — ABNORMAL LOW (ref 36.0–46.0)
Hemoglobin: 7.8 g/dL — ABNORMAL LOW (ref 12.0–15.0)
MCH: 22.3 pg — ABNORMAL LOW (ref 26.0–34.0)
MCHC: 31.6 g/dL (ref 30.0–36.0)
MCV: 70.6 fL — ABNORMAL LOW (ref 78.0–100.0)
Platelets: 290 10*3/uL (ref 150–400)
RBC: 3.5 MIL/uL — ABNORMAL LOW (ref 3.87–5.11)
RDW: 15.9 % — ABNORMAL HIGH (ref 11.5–15.5)
WBC: 5.8 10*3/uL (ref 4.0–10.5)

## 2016-03-07 LAB — URINALYSIS, ROUTINE W REFLEX MICROSCOPIC
Bilirubin Urine: NEGATIVE
GLUCOSE, UA: NEGATIVE mg/dL
KETONES UR: NEGATIVE mg/dL
Leukocytes, UA: NEGATIVE
Nitrite: NEGATIVE
PROTEIN: NEGATIVE mg/dL
Specific Gravity, Urine: 1.02 (ref 1.005–1.030)
pH: 6.5 (ref 5.0–8.0)

## 2016-03-07 LAB — HCG, QUANTITATIVE, PREGNANCY: hCG, Beta Chain, Quant, S: 54 m[IU]/mL — ABNORMAL HIGH (ref <5)

## 2016-03-07 MED ORDER — OXYCODONE-ACETAMINOPHEN 5-325 MG PO TABS: 1.0000 | ORAL_TABLET | ORAL | Status: DC | PRN

## 2016-03-07 MED ORDER — FERROUS SULFATE 325 (65 FE) MG PO TABS: 325.0000 mg | ORAL_TABLET | Freq: Every day | ORAL | Status: DC

## 2016-03-07 MED ORDER — MISOPROSTOL 200 MCG PO TABS: 800.0000 ug | ORAL_TABLET | Freq: Once | ORAL | Status: DC

## 2016-03-07 NOTE — MAU Provider Note (Signed)
  History     CSN: [id number]  Arrival date and time: 03/07/16 1540   First Provider Initiated Contact with Patient 03/07/16 1616      Chief Complaint  Patient presents with  . Vaginal Bleeding   HPI  Leah Webb 20 y.o. G2P1011 post TAB one month ago. She has c/o passing large blood clots this morning. She has had continual bleeding since the abortion but it has not been as heavy as today.  Past Medical History  Diagnosis Date  . Anemia     Past Surgical History  Procedure Laterality Date  . Eye surgery    . Eye surgery    . Breast surgery    . Liposuction      Family History  Problem Relation Age of Onset  . Heart disease Father   . Hyperlipidemia Maternal Grandmother     Social History  Substance Use Topics  . Smoking status: Never Smoker   . Smokeless tobacco: Never Used  . Alcohol Use: No    Allergies:  Allergies  Allergen Reactions  . Latex Itching and Other (See Comments)    Reaction:  Burning     Prescriptions prior to admission  Medication Sig Dispense Refill Last Dose  . norelgestromin-ethinyl estradiol Burr Medico(XULANE) 150-35 MCG/24HR transdermal patch Place 1 patch onto the skin once a week. Pt applies patch on Tuesday.   03/03/2016 at unknown     Review of Systems  Constitutional: Negative for fever.  Genitourinary:       Vaginal bleeding  All other systems reviewed and are negative.  Physical Exam   Blood pressure 131/71, pulse 83, temperature 98.1 F (36.7 C), temperature source Oral, resp. rate 18, height 5\' 3"  (1.6 m), weight 172 lb (78.019 kg), unknown if currently breastfeeding.  Physical Exam  Nursing note and vitals reviewed. Constitutional: She is oriented to person, place, and time. She appears well-developed and well-nourished. No distress.  HENT:  Head: Normocephalic and atraumatic.  Neck: Normal range of motion.  Cardiovascular: Normal rate.   Respiratory: Effort normal. No respiratory distress.  GI: Soft. She exhibits no  distension.  Genitourinary: Vagina normal.  Small amount of vaginal bleeding. OS open .5cm  Musculoskeletal: Normal range of motion.  Neurological: She is alert and oriented to person, place, and time.  Skin: Skin is warm and dry.  Psychiatric: She has a normal mood and affect. Her behavior is normal. Judgment and thought content normal.    MAU Course  Procedures  MDM  Discussed POC with Dr. Debroah LoopArnold. Pt will be given po cytotec and will need to follow up with repeat beta hcg  Assessment and Plan  TAB- POC Cytotec 800mcg PO Percocet 5/325mg  #10 Discharge  Leah Webb 03/07/2016, 5:52 PM

## 2016-03-07 NOTE — MAU Note (Signed)
Has TAB x 1 month ago using pill.  Bleeding like expected then everything slowed down.  Bleeding started back up in past 3 day heavier. Today big mango sized clots are falling out.  Went back for post procedure last week and they noticed lots of clots in uterus.  Since clots stopped they assumed it was normal menstrual cycle.  Clotting started back up today

## 2016-03-07 NOTE — Discharge Instructions (Signed)
Misoprostol tablets What is this medicine? MISOPROSTOL (mye soe PROST ole) helps to prevent stomach ulcers in patients who take medicines like ibuprofen and aspirin and who are at high risk of complications from ulcers. This medicine may be used for other purposes; ask your health care provider or pharmacist if you have questions. What should I tell my health care provider before I take this medicine? They need to know if you have any of these conditions: -Crohn's disease -heart disease -kidney disease -ulcerative colitis -an unusual or allergic reaction to misoprostol, prostaglandins, other medicines, foods, dyes, or preservatives -pregnant or trying to get pregnant -breast-feeding How should I use this medicine? Take this medicine by mouth with a full glass of water. Follow the directions on the prescription label. Take this medicine with food.Take your medicine at regular intervals. Do not take your medicine more often than directed. Talk to your pediatrician regarding the use of this medicine in children. Special care may be needed. Overdosage: If you think you have taken too much of this medicine contact a poison control center or emergency room at once. NOTE: This medicine is only for you. Do not share this medicine with others. What if I miss a dose? If you miss a dose, take it as soon as you can. If it is almost time for your next dose, take only that dose. Do not take double or extra doses. What may interact with this medicine? -antacids This list may not describe all possible interactions. Give your health care provider a list of all the medicines, herbs, non-prescription drugs, or dietary supplements you use. Also tell them if you smoke, drink alcohol, or use illegal drugs. Some items may interact with your medicine. What should I watch for while using this medicine? Do not smoke cigarettes or drink alcohol. These increase irritation to your stomach and can make it more susceptible  to damage from medicine like ibuprofen and aspirin. If you are female, do not use this medicine if you are pregnant. Do not get pregnant while taking this medicine and for at least one month (one full menstrual cycle) after stopping this medicine. If you can become pregnant, use a reliable form of birth control while taking this medicine. Talk to your doctor about birth control options. If you do become pregnant, think you are pregnant, or want to become pregnant, immediately call your doctor for advice. What side effects may I notice from receiving this medicine? Side effects that you should report to your doctor or health care professional as soon as possible: -allergic reactions like skin rash, itching or hives, swelling of the face, lips, or tongue -chest pain -fainting spells -severe diarrhea -sudden shortness of breath -unusual vaginal bleeding, pelvic pain, or cramping Side effects that usually do not require medical attention (report to your doctor or health care professional if they continue or are bothersome): -dizziness -headache -menstrual irregularity, spotting, or cramps -mild diarrhea -nausea -stomach upset or cramps This list may not describe all possible side effects. Call your doctor for medical advice about side effects. You may report side effects to FDA at 1-800-FDA-1088. Where should I keep my medicine? Keep out of the reach of children. Store at room temperature below 25 degrees C (77 degrees F). Keep in a dry place. Protect from moisture. Throw away any unused medicine after the expiration date. NOTE: This sheet is a summary. It may not cover all possible information. If you have questions about this medicine, talk to your doctor, pharmacist, or  health care provider.    2016, Elsevier/Gold Standard. (2008-10-16 10:59:53)

## 2016-03-09 ENCOUNTER — Inpatient Hospital Stay (HOSPITAL_COMMUNITY)
Admission: AD | Admit: 2016-03-09 | Discharge: 2016-03-09 | Disposition: A | Discharge status: Home / Self Care | Payer: BLUE CROSS/BLUE SHIELD | Source: Ambulatory Visit | Attending: Family Medicine | Admitting: Family Medicine

## 2016-03-09 ENCOUNTER — Encounter (HOSPITAL_COMMUNITY): Payer: Self-pay | Admitting: *Deleted

## 2016-03-09 DIAGNOSIS — O074 Failed attempted termination of pregnancy without complication: Secondary | ICD-10-CM | POA: Diagnosis not present

## 2016-03-09 DIAGNOSIS — O046 Delayed or excessive hemorrhage following (induced) termination of pregnancy: Secondary | ICD-10-CM | POA: Insufficient documentation

## 2016-03-09 DIAGNOSIS — O034 Incomplete spontaneous abortion without complication: Secondary | ICD-10-CM

## 2016-03-09 LAB — HCG, QUANTITATIVE, PREGNANCY: HCG, BETA CHAIN, QUANT, S: 46 m[IU]/mL — AB (ref <5)

## 2016-03-09 MED ORDER — MISOPROSTOL 200 MCG PO TABS: ORAL_TABLET | ORAL | Status: DC

## 2016-03-09 NOTE — Discharge Instructions (Signed)
Incomplete  A miscarriage is the sudden loss of an unborn baby (fetus) before the 20th week of pregnancy. In an incomplete miscarriage, parts of the fetus or placenta (afterbirth) remain in the body.  Having a miscarriage can be an emotional experience. Talk with your health care provider about any questions you may have about miscarrying, the grieving process, and your future pregnancy plans. CAUSES   Problems with the fetal chromosomes that make it impossible for the baby to develop normally. Problems with the baby's genes or chromosomes are most often the result of errors that occur by chance as the embryo divides and grows. The problems are not inherited from the parents.  Infection of the cervix or uterus.  Hormone problems.  Problems with the cervix, such as having an incompetent cervix. This is when the tissue in the cervix is not strong enough to hold the pregnancy.  Problems with the uterus, such as an abnormally shaped uterus, uterine fibroids, or congenital abnormalities.  Certain medical conditions.  Smoking, drinking alcohol, or taking illegal drugs.  Trauma. SYMPTOMS   Vaginal bleeding or spotting, with or without cramps or pain.  Pain or cramping in the abdomen or lower back.  Passing fluid, tissue, or blood clots from the vagina. DIAGNOSIS  Your health care provider will perform a physical exam. You may also have an ultrasound to confirm the miscarriage. Blood or urine tests may also be ordered. TREATMENT   Usually, a dilation and curettage (D&C) procedure is performed. During a D&C procedure, the cervix is widened (dilated) and any remaining fetal or placental tissue is gently removed from the uterus.  Antibiotic medicines are prescribed if there is an infection. Other medicines may be given to reduce the size of the uterus (contract) if there is a lot of bleeding.  If you have Rh negative blood and your baby was Rh positive, you will need a Rho (D) immune globulin  shot. This shot will protect any future baby from having Rh blood problems in future pregnancies.  You may be confined to bed rest. This means you should stay in bed and only get up to use the bathroom. HOME CARE INSTRUCTIONS   Rest as directed by your health care provider.  Restrict activity as directed by your health care provider. You may be allowed to continue light activity if curettage was not done but you require further treatment.  Keep track of the number of pads you use each day. Keep track of how soaked (saturated) they are. Record this information.  Do not  use tampons.  Do not douche or have sexual intercourse until approved by your health care provider.  Keep all follow-up appointments for reevaluation and continuing management.  Only take over-the-counter or prescription medicines for pain, fever, or discomfort as directed by your health care provider.  Take antibiotic medicine as directed by your health care provider. Make sure you finish it even if you start to feel better. SEEK IMMEDIATE MEDICAL CARE IF:   You experience severe cramps in your stomach, back, or abdomen.  You have an unexplained temperature (make sure to record these temperatures).  You pass large clots or tissue (save these for your health care provider to inspect).  Your bleeding increases.  You become light-headed, weak, or have fainting episodes. MAKE SURE YOU:   Understand these instructions.  Will watch your condition.  Will get help right away if you are not doing well or get worse.   This information is not intended to  replace advice given to you by your health care provider. Make sure you discuss any questions you have with your health care provider.   Document Released: 11/02/2005 Document Revised: 11/23/2014 Document Reviewed: 06/01/2013 Elsevier Interactive Patient Education 2016 Elsevier Inc.  

## 2016-03-09 NOTE — MAU Note (Signed)
Pt reports she was her on Saturday and had retained POC's after a TAB. States she was given Cytotec and took it on Sunday afternoon but nothing has happened. States she is not having any pain and very little bleeding (2 pads today). Called and was told to come in.

## 2016-03-09 NOTE — MAU Provider Note (Signed)
History     CSN: [id number]  Arrival date and time: 03/09/16 1750   First Provider Initiated Contact with Patient 03/09/16 2047      Chief Complaint  Patient presents with  . Vaginal Bleeding   HPI  Ms. Leah Webb is a 20 y.o. G2P1011 who presents to MAU today with complaint of light bleeding. The patient states that she had recent TAB and was seen in MAU on Saturday with heavy bleeding. US showed possible retained POC and hcg was 54 at that time. She was given Cytotec which she was supposed to take buccally and took PO instead. She denies any change in bleeding since then, and is having light bleeding today. She does not feel that the Cytotec worked. She denies pain or fever today.   OB History    Gravida Para Term Preterm AB TAB SAB Ectopic Multiple Living   2 1 1  1 1    0 1      Past Medical History  Diagnosis Date  . Anemia     Past Surgical History  Procedure Laterality Date  . Eye surgery    . Eye surgery    . Breast surgery    . Liposuction    . Induced abortion      Family History  Problem Relation Age of Onset  . Heart disease Father   . Hyperlipidemia Maternal Grandmother     Social History  Substance Use Topics  . Smoking status: Never Smoker   . Smokeless tobacco: Never Used  . Alcohol Use: No    Allergies:  Allergies  Allergen Reactions  . Latex Itching and Other (See Comments)    Reaction:  Burning     No prescriptions prior to admission    Review of Systems  Constitutional: Negative for fever.  Gastrointestinal: Negative for nausea, vomiting and abdominal pain.  Genitourinary:       + vaginal bleeding   Physical Exam   Blood pressure 131/79, pulse 87, temperature 98.6 F (37 C), temperature source Oral, resp. rate 16, height 5\' 3"  (1.6 m), weight 172 lb (78.019 kg), SpO2 100 %, unknown if currently breastfeeding.  Physical Exam  Nursing note and vitals reviewed. Constitutional: She is oriented to person, place, and time. She  appears well-developed and well-nourished. No distress.  HENT:  Head: Normocephalic and atraumatic.  Cardiovascular: Normal rate.   Respiratory: Effort normal.  GI: Soft. She exhibits no distension and no mass. There is no tenderness. There is no rebound and no guarding.  Neurological: She is alert and oriented to person, place, and time.  Skin: Skin is warm and dry. No erythema.  Psychiatric: She has a normal mood and affect.     Results for orders placed or performed during the hospital encounter of 03/09/16 (from the past 24 hour(s))  hCG, quantitative, pregnancy     Status: Abnormal   Collection Time: 03/09/16  7:55 PM  Result Value Ref Range   hCG, Beta Chain, Quant, S 46 (H) <5 mIU/mL    MAU Course  Procedures None  MDM HCG today with slight decreased from 4/22, will repeat Cytotec vaginally and have patient follow-up for labs in 1 week in [facility]  Assessment and Plan  A: Retained POC S/p TAB  P: Discharge home Rx for Cytotec given to patient Bleeding precautions discussed Patient advised to follow-up with [facility] in 1 week for repeat labs Patient may return to MAU as needed or if her condition were to change or  worsen   Marny Lowenstein, PA-C  03/09/2016, 10:10 PM

## 2016-03-19 ENCOUNTER — Other Ambulatory Visit: Payer: BLUE CROSS/BLUE SHIELD

## 2017-04-22 ENCOUNTER — Encounter: Payer: Self-pay | Admitting: Obstetrics & Gynecology

## 2017-04-22 ENCOUNTER — Ambulatory Visit (INDEPENDENT_AMBULATORY_CARE_PROVIDER_SITE_OTHER): Payer: BLUE CROSS/BLUE SHIELD

## 2017-04-22 ENCOUNTER — Ambulatory Visit (INDEPENDENT_AMBULATORY_CARE_PROVIDER_SITE_OTHER): Payer: BLUE CROSS/BLUE SHIELD | Admitting: Obstetrics & Gynecology

## 2017-04-22 ENCOUNTER — Other Ambulatory Visit: Payer: Self-pay | Admitting: Obstetrics & Gynecology

## 2017-04-22 VITALS — BP 132/86 | Ht 64.5 in | Wt 195.0 lb

## 2017-04-22 DIAGNOSIS — Z3490 Encounter for supervision of normal pregnancy, unspecified, unspecified trimester: Secondary | ICD-10-CM

## 2017-04-22 DIAGNOSIS — O021 Missed abortion: Secondary | ICD-10-CM | POA: Diagnosis not present

## 2017-04-22 DIAGNOSIS — O289 Unspecified abnormal findings on antenatal screening of mother: Secondary | ICD-10-CM

## 2017-04-22 DIAGNOSIS — O200 Threatened abortion: Secondary | ICD-10-CM

## 2017-04-22 LAB — HCG, QUANTITATIVE, PREGNANCY: hCG, Beta Chain, Quant, S: 12269 m[IU]/mL — ABNORMAL HIGH

## 2017-04-22 NOTE — Patient Instructions (Signed)
1. Early stage of pregnancy Probably early new pregnancy at 5 wks 5 ds.  Quant BHCG compatible at 12 269 and conception date is possible on 5/11-12th which is 2 wks 6 days after the D+E.  Will repeat Quant Monday 6/11th and if rising, confirm a viable pregnancy in 2 wks with a repeat TV Ob US.  Patient desires pursuing the pregnancy.  Rh pos. - Beta HCG, Quant today - Beta HCG, Quant 6/11th - US OB Transvaginal today - US OB Transvaginal future  2. Abortion, missed Probably small POC from D+E.  Will reevaluated at next US in 2 wks.  Leah Webb, it was a pleasure to see you today!  I will inform you of your result as soon as available and see you again in 2 wks.

## 2017-04-22 NOTE — Progress Notes (Signed)
    Leah Webb 06/04/1996 [id number]        21 y.o.  G2P1011 New relationship, stable boyfriend.  Son is 2 yrs and 4 months.  O pos per chart.  LMP 01/14/2017  RP:  [place]?  New pregnancy?  HPI:  LMP normal 01/14/2017.  [facility]  No bleeding since.  Unprotected IC 2 1/2 wk after TAb.  Ob US yesterday demonstrated a 5 wk GS per patient.  No pelvic pain.  No abnormal vaginal d/c.  No fever.  Mild Nausea/H/As, feels pregnant.  If pregnancy is viable, wants to pursue pregnancy.   Past medical history,surgical history, problem list, medications, allergies, family history and social history were all reviewed and documented in the EPIC chart.  Directed ROS with pertinent positives and negatives documented in the history of present illness/assessment and plan.  Exam:  Vitals:   04/22/17 1112  Weight: 195 lb (88.5 kg)  Height: 5' 4.5" (1.638 m)   General appearance:  Normal  Gyn exam:  Vulva normal                    Bimanual:  AV uterus, normal size, mobile, NT.  No adnexal mass or pain.  Long closed, firm cervix, NT.  No vaginal bleeding.  Normal secretions.  Quant BHCG today 12 269  Pelvic US today:  IU GS 5 wks 5 days, no EP, no YS.  Conception date 5/11-12.  Heterogeneous thicken endometrial pockets of fluid and tissue. Cervix long/closed.  Rt ovary normal.  Lt ovary with CL cyst 2.1 x 2.8 cm.  No FF in CDS.  Mild FF above uterus 2 x 1.2 cm.  Assessment/Plan:  21 y.o. G2P1011   1. Early stage of pregnancy Probably early new pregnancy at 5 wks 5 ds.  Quant BHCG compatible at 12 269 and conception date is possible on 5/11-12th which is 2 wks 6 days after the D+E.  Will repeat Quant Monday 6/11th and if rising, confirm a viable pregnancy in 2 wks with a repeat TV Ob US.  Patient desires pursuing the pregnancy.  Rh pos. - Beta HCG, Quant today - Beta HCG, Quant 6/11th - US OB Transvaginal today - US OB Transvaginal future  2.  Abortion, missed Probably small POC from D+E.  Will reevaluated at next US in 2 wks.  Counseling on above issues >50% x 25 minutes.  Genia DelMarie-Lyne Madisen Ludvigsen MD, 11:19 AM 04/22/2017

## 2017-04-26 ENCOUNTER — Other Ambulatory Visit: Payer: BLUE CROSS/BLUE SHIELD

## 2017-04-26 DIAGNOSIS — Z3490 Encounter for supervision of normal pregnancy, unspecified, unspecified trimester: Secondary | ICD-10-CM

## 2017-04-27 LAB — HCG, QUANTITATIVE, PREGNANCY: hCG, Beta Chain, Quant, S: 33575.4 m[IU]/mL — ABNORMAL HIGH

## 2017-05-06 ENCOUNTER — Ambulatory Visit (INDEPENDENT_AMBULATORY_CARE_PROVIDER_SITE_OTHER): Payer: BLUE CROSS/BLUE SHIELD | Admitting: Obstetrics & Gynecology

## 2017-05-06 ENCOUNTER — Other Ambulatory Visit: Payer: Self-pay | Admitting: Obstetrics & Gynecology

## 2017-05-06 ENCOUNTER — Ambulatory Visit (INDEPENDENT_AMBULATORY_CARE_PROVIDER_SITE_OTHER): Payer: BLUE CROSS/BLUE SHIELD

## 2017-05-06 DIAGNOSIS — Z3490 Encounter for supervision of normal pregnancy, unspecified, unspecified trimester: Secondary | ICD-10-CM | POA: Diagnosis not present

## 2017-05-06 DIAGNOSIS — O021 Missed abortion: Secondary | ICD-10-CM

## 2017-05-06 DIAGNOSIS — Z3491 Encounter for supervision of normal pregnancy, unspecified, first trimester: Secondary | ICD-10-CM | POA: Diagnosis not present

## 2017-05-06 DIAGNOSIS — O219 Vomiting of pregnancy, unspecified: Secondary | ICD-10-CM | POA: Diagnosis not present

## 2017-05-06 MED ORDER — DOXYLAMINE-PYRIDOXINE 10-10 MG PO TBEC: 2.0000 | DELAYED_RELEASE_TABLET | Freq: Every evening | ORAL | 2 refills | Status: AC | PRN

## 2017-05-06 NOTE — Patient Instructions (Signed)
1. Early stage of pregnancy Confirmed viability.  US dating 7.0 wks by CRL.  FHR 140/min.  Will take PNVs OTC.  [place].  2. Nausea/vomiting in pregnancy Vit B6 recommended.  Diclegis prescribed.  Informed and reassured that it is a Class A medication in pregnancy.  Usage reviewed.  3. Abortion, missed Recent TAB, just before this pregnancy.  No longer seeing retained POC from previous TAB on US today.  Momo, I wish you the very best!  You know I'm here for you if you need me.

## 2017-05-06 NOTE — Progress Notes (Signed)
    Leah AcreBrandy A Webb 01/02/1996 [id number]        21 y.o.  G3P1011   RP:  F/U viability  HPI:  No vaginal bleeding.  No pelvic pain.  C/O N/V everyday.  Past medical history,surgical history, problem list, medications, allergies, family history and social history were all reviewed and documented in the EPIC chart.  Directed ROS with pertinent positives and negatives documented in the history of present illness/assessment and plan.  Exam:  There were no vitals filed for this visit. General appearance:  Normal  Pelvic US:  sIUP 7.0 wks by CRL.  FHR 140/min.  EDD US 12/23/2017.  Assessment/Plan:  21 y.o. G3P1011   1. Early stage of pregnancy Confirmed viability.  US dating 7.0 wks by CRL.  FHR 140/min.  Will take PNVs OTC.  [place].  2. Nausea/vomiting in pregnancy Vit B6 recommended.  Diclegis prescribed.  Informed and reassured that it is a Class A medication in pregnancy.  Usage reviewed.  3. Abortion, missed Recent TAB, just before this pregnancy.  No longer seeing retained POC from previous TAB on US today.  Counseling on above issues >50% x 15 minutes.  Genia DelMarie-Lyne Connie Lasater MD, 4:38 PM 05/06/2017

## 2017-10-09 IMAGING — US US OB TRANSVAGINAL
1 series · 15 of 28 positions shown · non-contrast
Comparison: None.

CLINICAL DATA: 20-year-old female status post medical abortion
01/25/2016, presenting with heavy bleeding. Quantitative beta HCG
54.

EXAM:
OBSTETRIC <14 WK US AND TRANSVAGINAL OB US
TECHNIQUE: Both transabdominal and transvaginal ultrasound examinations were
performed for complete evaluation of the gestation as well as the
maternal uterus, adnexal regions, and pelvic cul-de-sac.
Transvaginal technique was performed to assess early pregnancy.

[Series 1: us ob transvaginal · 57 acquisitions, 15 frames shown]
[im 1/57]
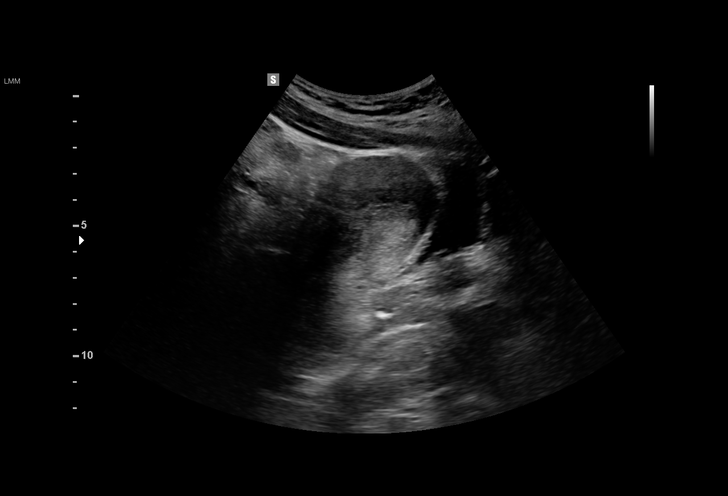
[im 5/57]
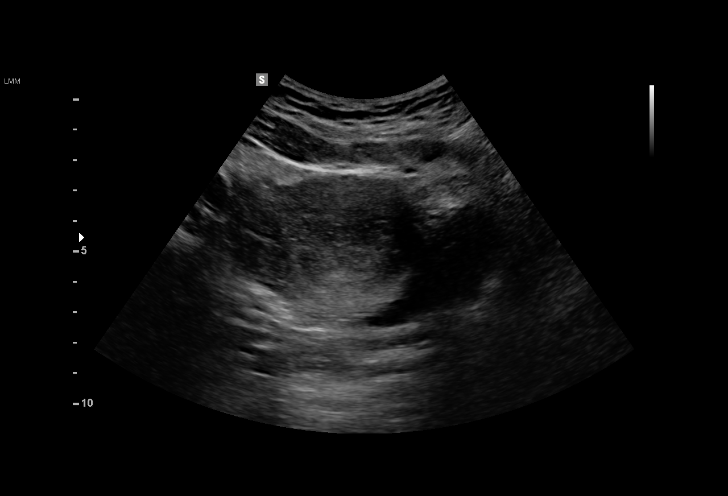
[im 9/57]
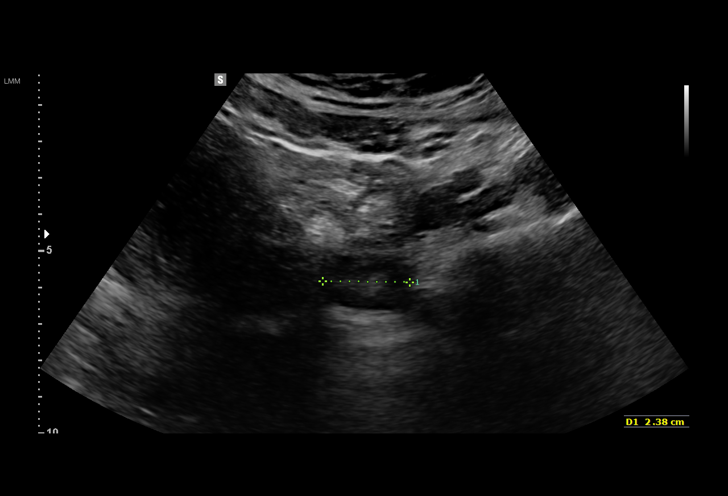
[im 13/57]
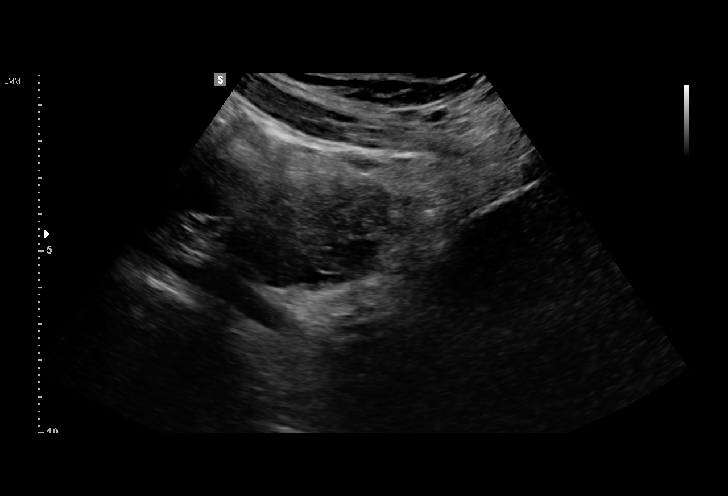
[im 17/57]
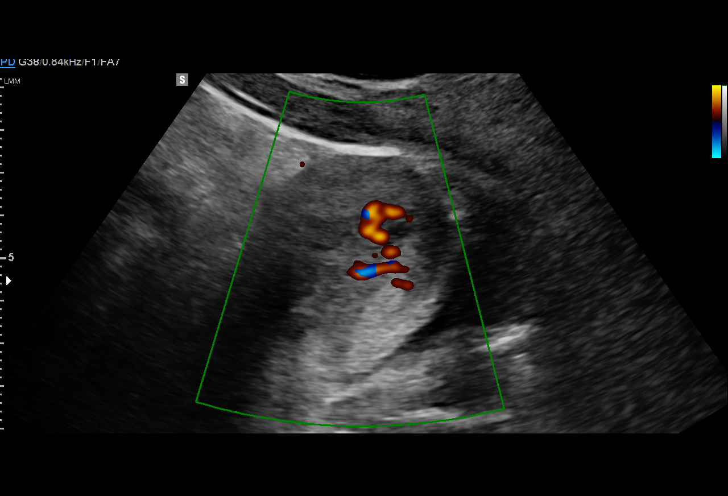
[im 21/57]
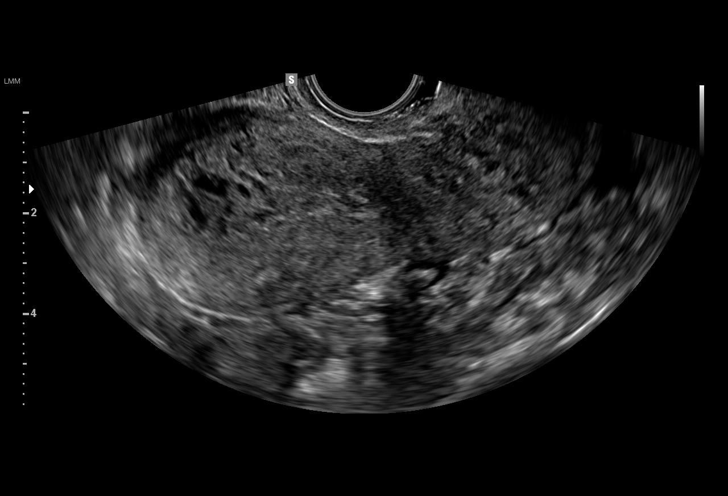
[im 25/57]
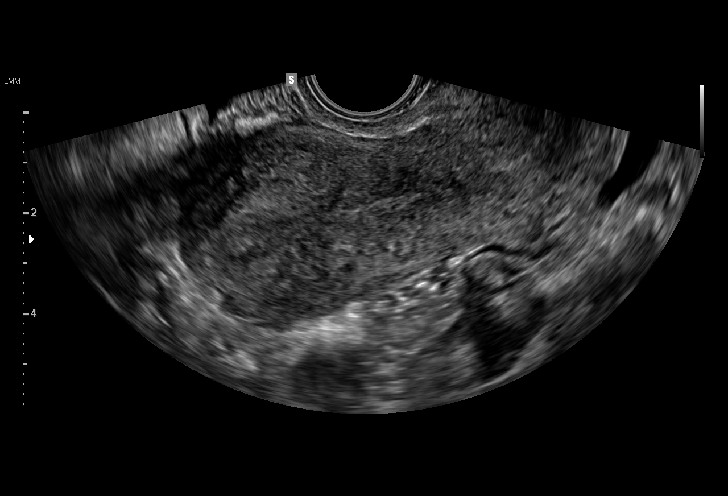
[im 30/57]
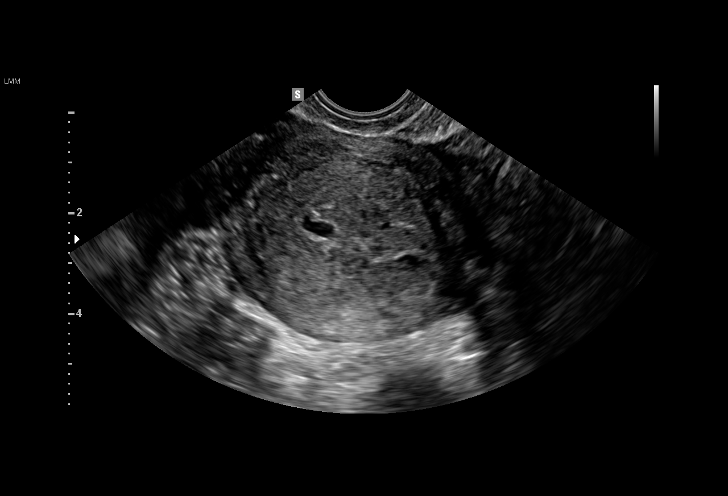
[im 32/57]
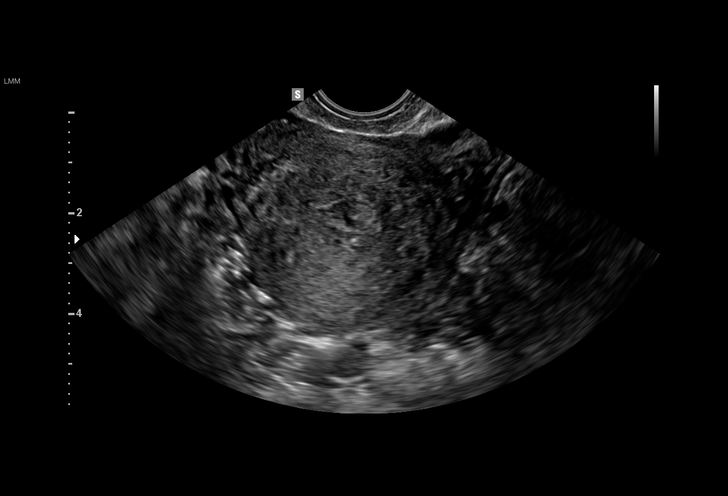
[im 36/57]
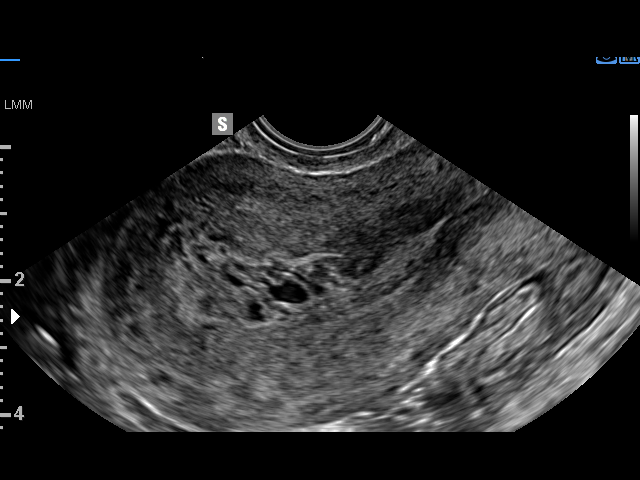
[im 40/57]
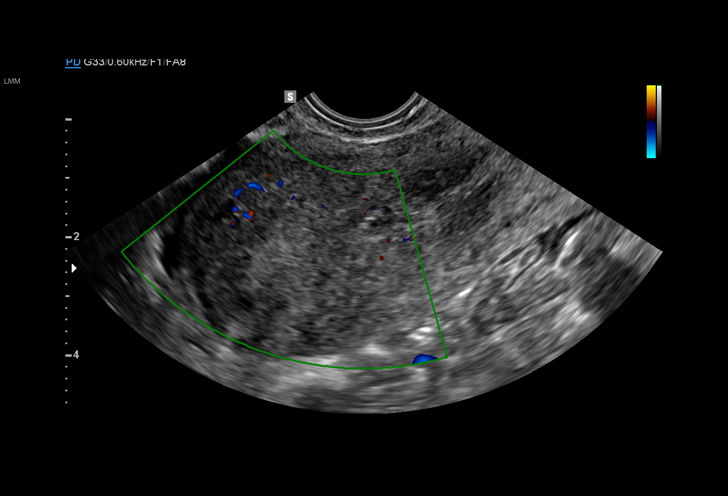
[im 44/57]
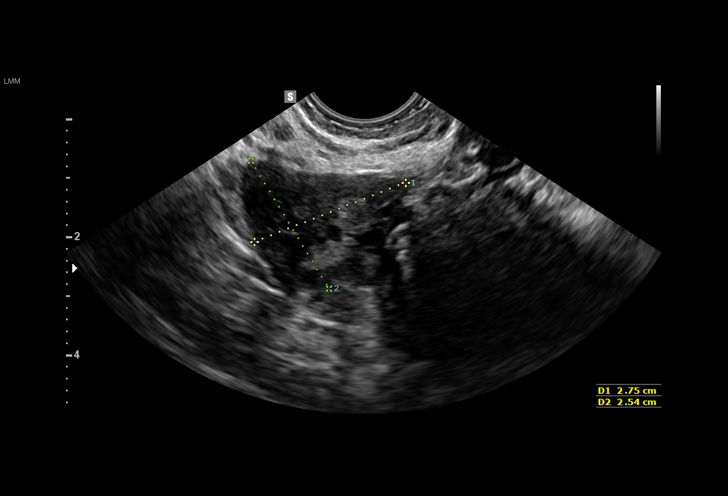
[im 48/57]
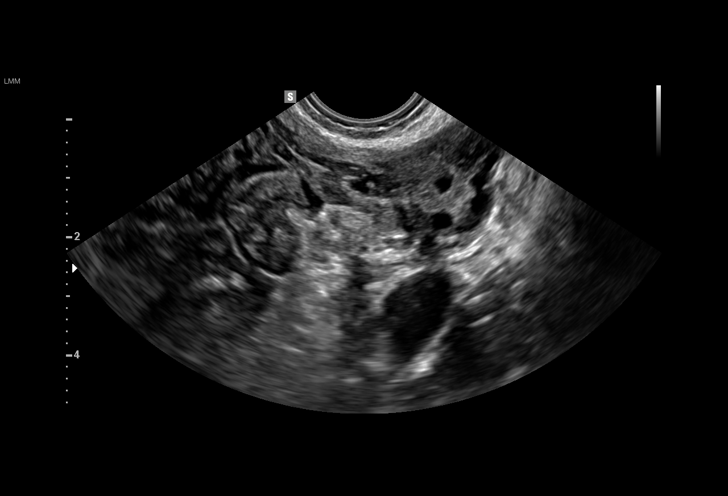
[im 52/57]
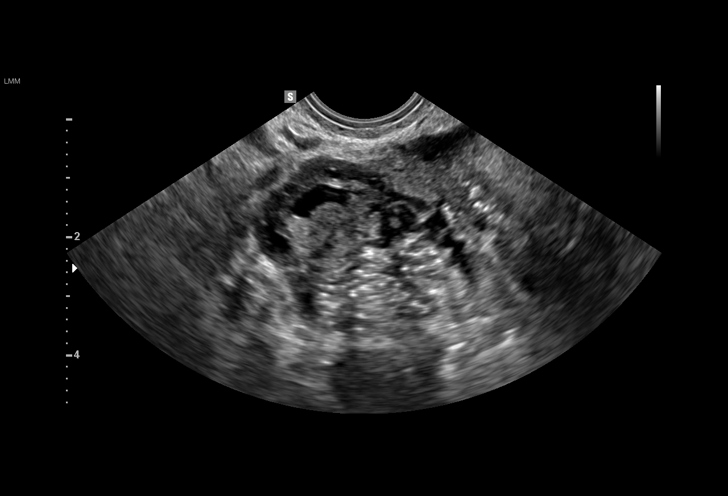
[im 57/57]
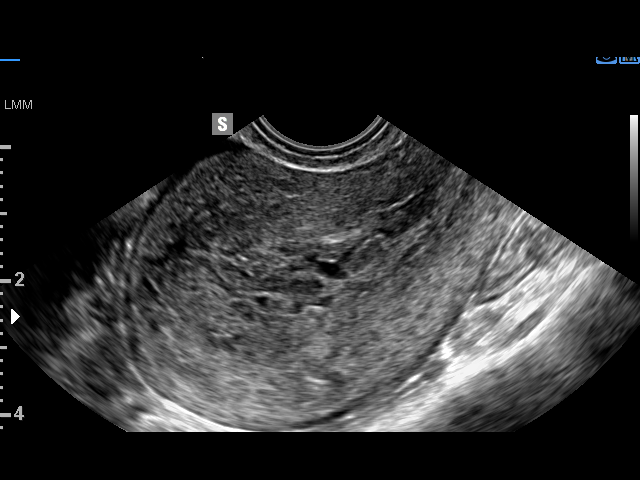

[15 of 28 positions shown; findings below may reference images not displayed]

FINDINGS: Anteverted anteflexed uterus measures 9.7 x 4.3 x 4.5 cm and is
normal in size and configuration. No uterine fibroids or other
myometrial abnormalities.

Bilayer endometrial thickness is 19 mm. Endometrium is diffusely
heterogeneous and prominently hypervascular on color Doppler. No
focal endometrial mass.

Left ovary measures 2.8 x 2.5 x 2.6 cm. Right ovary measures 3.4 x
2.6 x 2.3 cm. No ovarian or adnexal masses. No abnormal free fluid
in the pelvis.
IMPRESSION: 1. Thickened (19 mm), heterogeneous and prominently hypervascular
endometrium, consistent with retained products of conception in the
proper clinical setting.
2. Normal ovaries.  No adnexal abnormality.
